# Patient Record
Sex: Female | Born: 1985 | Race: Black or African American | Hispanic: No | Marital: Single | Smoking: Never smoker
Health system: Southern US, Community
[De-identification: ages and names within clinical notes are randomized; demographics above are authoritative.]

## PROBLEM LIST (undated history)

## (undated) DIAGNOSIS — G43909 Migraine, unspecified, not intractable, without status migrainosus: Secondary | ICD-10-CM

---
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
## 2004-03-19 ENCOUNTER — Other Ambulatory Visit: Admission: RE | Admit: 2004-03-19 | Discharge: 2004-03-19 | Payer: Self-pay | Admitting: Family Medicine

## 2004-04-08 ENCOUNTER — Encounter: Admission: RE | Admit: 2004-04-08 | Discharge: 2004-04-08 | Payer: Self-pay | Admitting: Family Medicine

## 2004-11-25 ENCOUNTER — Emergency Department (HOSPITAL_COMMUNITY): Admission: EM | Admit: 2004-11-25 | Discharge: 2004-11-25 | Payer: Self-pay | Admitting: Emergency Medicine

## 2005-04-28 ENCOUNTER — Other Ambulatory Visit: Admission: RE | Admit: 2005-04-28 | Discharge: 2005-04-28 | Payer: Self-pay | Admitting: Family Medicine

## 2005-09-25 IMAGING — CR DG KNEE 1-2V*L*
2 series · 2 of 2 positions shown · non-contrast
Comparison: none

CLINICAL DATA: Pain without trauma

Left knee 2 view: 
There is no evidence of fracture.  Normal alignment. There is no evidence of
arthropathy or other focal bone abnormality.  Soft tissues are unremarkable.

[view not recorded (1 of 2)]
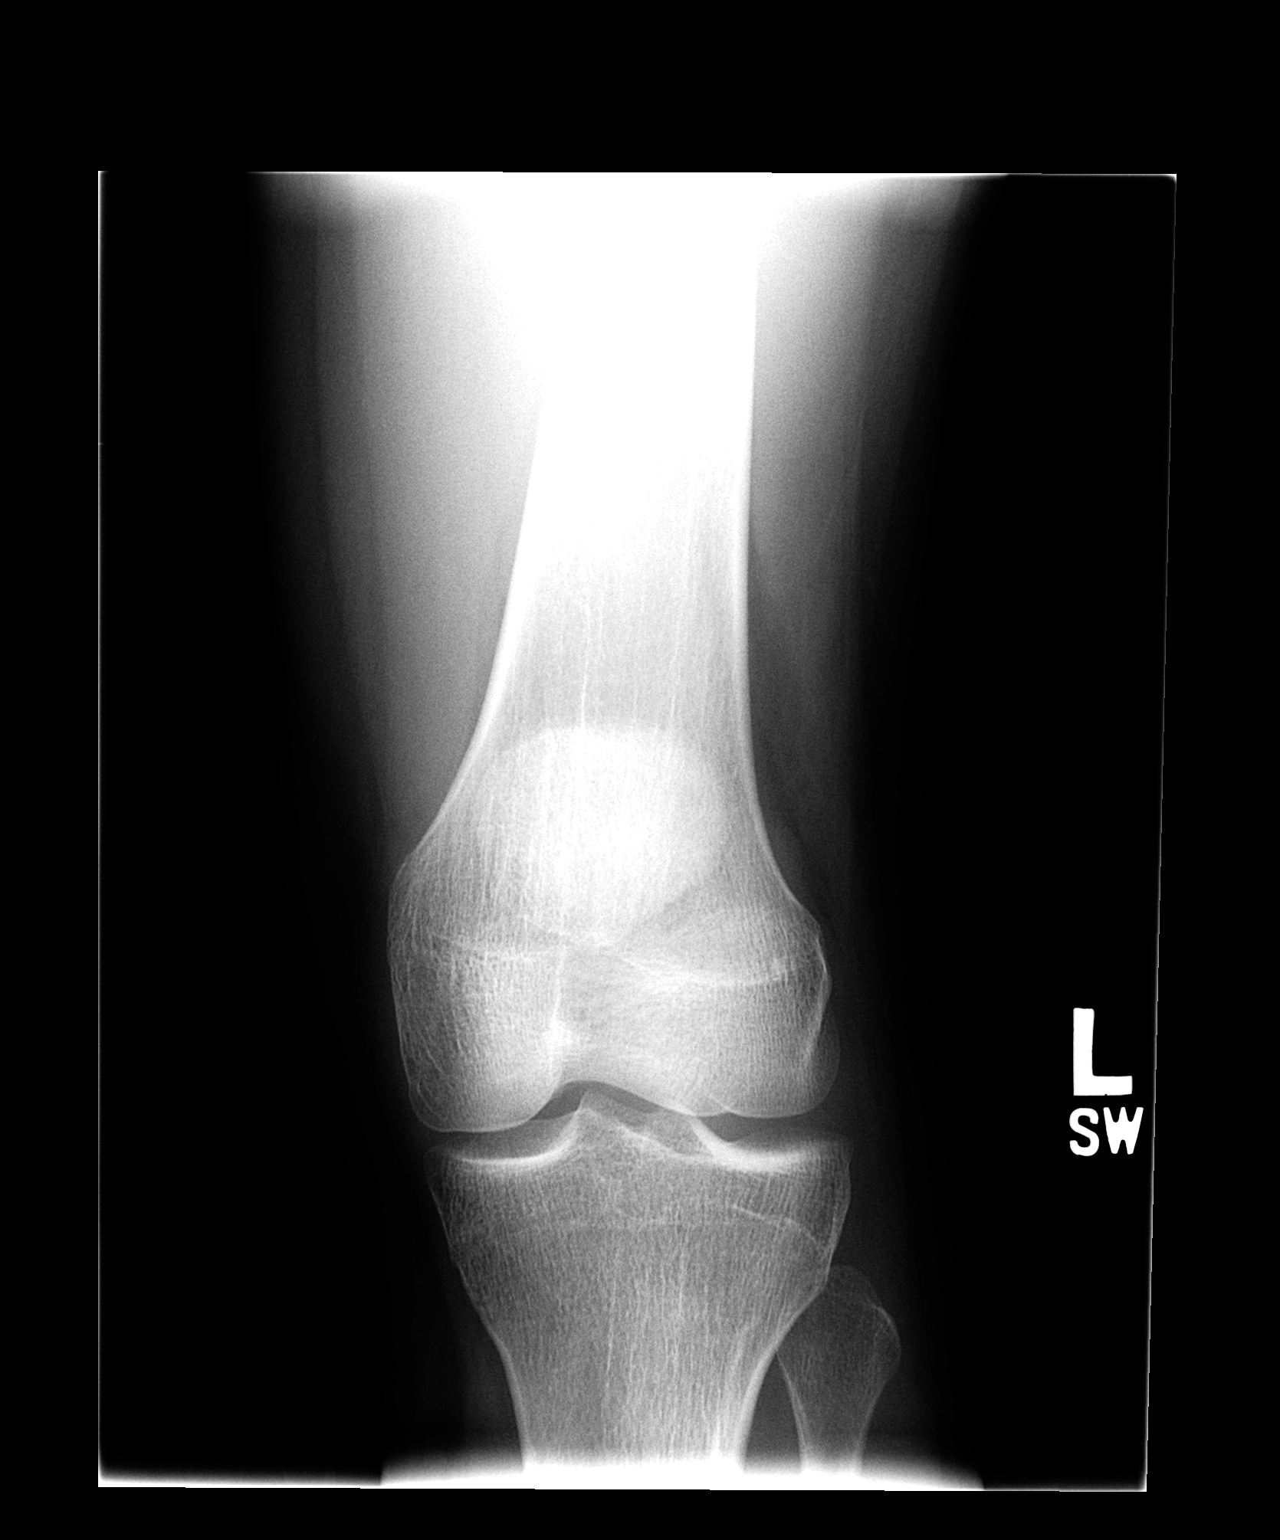

[view not recorded (2 of 2)]
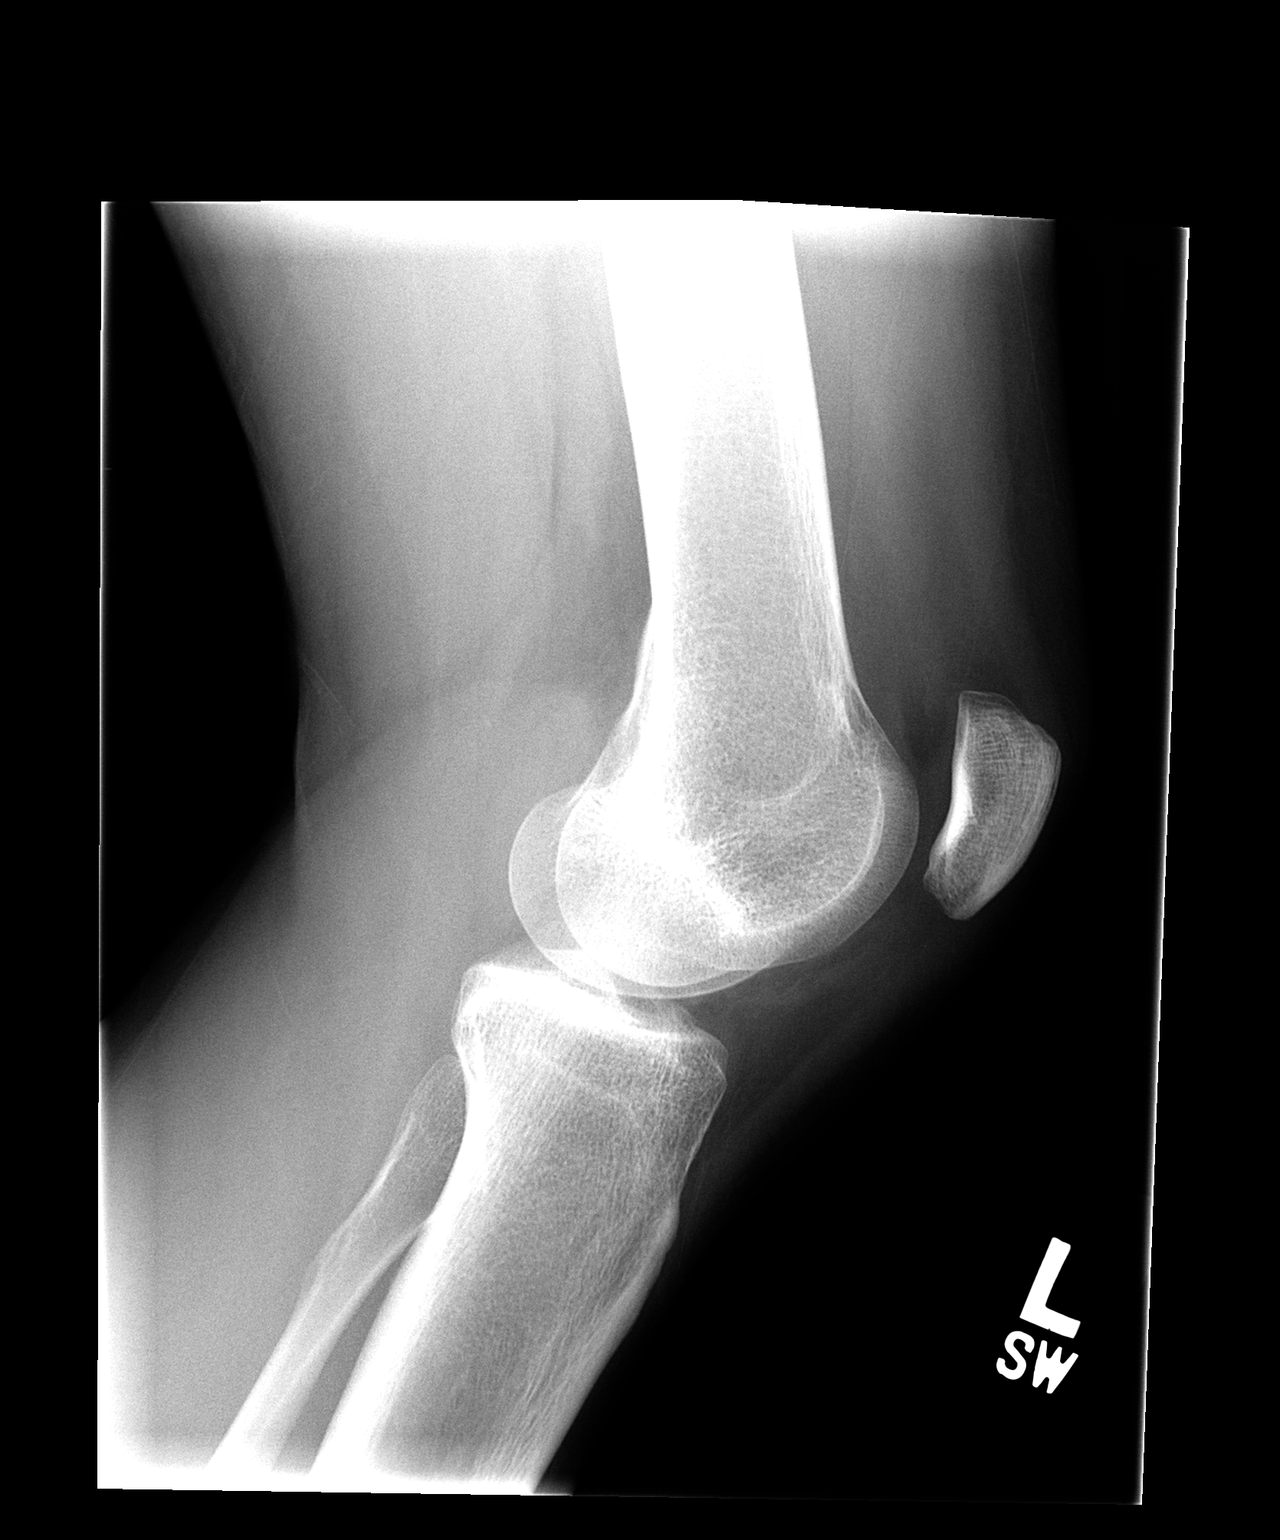

[2 of 2 positions shown; findings below may reference images not displayed]

IMPRESSION: Negative.

## 2006-01-03 ENCOUNTER — Emergency Department (HOSPITAL_COMMUNITY): Admission: EM | Admit: 2006-01-03 | Discharge: 2006-01-04 | Payer: Self-pay | Admitting: Emergency Medicine

## 2006-04-05 ENCOUNTER — Emergency Department (HOSPITAL_COMMUNITY): Admission: EM | Admit: 2006-04-05 | Discharge: 2006-04-05 | Payer: Self-pay | Admitting: Emergency Medicine

## 2006-06-03 ENCOUNTER — Other Ambulatory Visit: Admission: RE | Admit: 2006-06-03 | Discharge: 2006-06-03 | Payer: Self-pay | Admitting: Family Medicine

## 2007-07-27 ENCOUNTER — Other Ambulatory Visit: Admission: RE | Admit: 2007-07-27 | Discharge: 2007-07-27 | Payer: Self-pay | Admitting: Family Medicine

## 2008-09-15 ENCOUNTER — Other Ambulatory Visit: Admission: RE | Admit: 2008-09-15 | Discharge: 2008-09-15 | Payer: Self-pay | Admitting: Family Medicine

## 2008-10-28 ENCOUNTER — Emergency Department (HOSPITAL_COMMUNITY): Admission: EM | Admit: 2008-10-28 | Discharge: 2008-10-28 | Payer: Self-pay | Admitting: Emergency Medicine

## 2009-10-10 ENCOUNTER — Other Ambulatory Visit: Admission: RE | Admit: 2009-10-10 | Discharge: 2009-10-10 | Payer: Self-pay | Admitting: Family Medicine

## 2010-04-18 LAB — URINALYSIS, ROUTINE W REFLEX MICROSCOPIC
Hgb urine dipstick: NEGATIVE
Ketones, ur: 15 mg/dL — AB
Nitrite: NEGATIVE
Specific Gravity, Urine: 1.026 (ref 1.005–1.030)
pH: 5.5 (ref 5.0–8.0)

## 2010-04-18 LAB — CBC
HCT: 38.7 % (ref 36.0–46.0)
Hemoglobin: 13.1 g/dL (ref 12.0–15.0)
MCV: 89.5 fL (ref 78.0–100.0)
Platelets: 280 10*3/uL (ref 150–400)
RDW: 13.3 % (ref 11.5–15.5)
WBC: 7.7 10*3/uL (ref 4.0–10.5)

## 2010-04-18 LAB — COMPREHENSIVE METABOLIC PANEL
ALT: 14 U/L (ref 0–35)
Albumin: 3.6 g/dL (ref 3.5–5.2)
Alkaline Phosphatase: 35 U/L — ABNORMAL LOW (ref 39–117)
BUN: 7 mg/dL (ref 6–23)
Chloride: 102 mEq/L (ref 96–112)
Potassium: 3.6 mEq/L (ref 3.5–5.1)
Total Bilirubin: 0.8 mg/dL (ref 0.3–1.2)
Total Protein: 7.7 g/dL (ref 6.0–8.3)

## 2010-04-18 LAB — URINE MICROSCOPIC-ADD ON

## 2010-04-18 LAB — PREGNANCY, URINE: Preg Test, Ur: NEGATIVE

## 2010-04-18 LAB — DIFFERENTIAL
Eosinophils Absolute: 0 10*3/uL (ref 0.0–0.7)
Eosinophils Relative: 0 % (ref 0–5)
Lymphs Abs: 1.1 10*3/uL (ref 0.7–4.0)
Monocytes Absolute: 0.5 10*3/uL (ref 0.1–1.0)

## 2010-04-18 LAB — LIPASE, BLOOD: Lipase: 26 U/L (ref 11–59)

## 2010-10-25 ENCOUNTER — Other Ambulatory Visit (HOSPITAL_COMMUNITY)
Admission: RE | Admit: 2010-10-25 | Discharge: 2010-10-25 | Disposition: A | Discharge status: Home / Self Care | Payer: Managed Care, Other (non HMO) | Source: Ambulatory Visit | Attending: Family Medicine | Admitting: Family Medicine

## 2010-10-25 ENCOUNTER — Other Ambulatory Visit: Payer: Self-pay | Admitting: Physician Assistant

## 2010-10-25 DIAGNOSIS — Z124 Encounter for screening for malignant neoplasm of cervix: Secondary | ICD-10-CM | POA: Insufficient documentation

## 2011-10-31 ENCOUNTER — Other Ambulatory Visit: Payer: Self-pay | Admitting: Physician Assistant

## 2011-10-31 ENCOUNTER — Other Ambulatory Visit (HOSPITAL_COMMUNITY)
Admission: RE | Admit: 2011-10-31 | Discharge: 2011-10-31 | Disposition: A | Discharge status: Home / Self Care | Payer: Managed Care, Other (non HMO) | Source: Ambulatory Visit | Attending: Family Medicine | Admitting: Family Medicine

## 2011-10-31 DIAGNOSIS — Z124 Encounter for screening for malignant neoplasm of cervix: Secondary | ICD-10-CM | POA: Insufficient documentation

## 2012-08-17 ENCOUNTER — Other Ambulatory Visit: Payer: Self-pay | Admitting: Family Medicine

## 2012-08-17 DIAGNOSIS — N63 Unspecified lump in unspecified breast: Secondary | ICD-10-CM

## 2012-08-23 ENCOUNTER — Other Ambulatory Visit: Payer: Self-pay | Admitting: Family Medicine

## 2012-08-23 ENCOUNTER — Ambulatory Visit
Admission: RE | Admit: 2012-08-23 | Discharge: 2012-08-23 | Disposition: A | Discharge status: Home / Self Care | Payer: BC Managed Care – PPO | Source: Ambulatory Visit | Attending: Family Medicine | Admitting: Family Medicine

## 2012-08-23 DIAGNOSIS — N63 Unspecified lump in unspecified breast: Secondary | ICD-10-CM

## 2013-04-13 ENCOUNTER — Other Ambulatory Visit (HOSPITAL_COMMUNITY)
Admission: RE | Admit: 2013-04-13 | Discharge: 2013-04-13 | Disposition: A | Discharge status: Home / Self Care | Payer: BC Managed Care – PPO | Source: Ambulatory Visit | Attending: Family Medicine | Admitting: Family Medicine

## 2013-04-13 ENCOUNTER — Other Ambulatory Visit: Payer: Self-pay | Admitting: Physician Assistant

## 2013-04-13 DIAGNOSIS — Z124 Encounter for screening for malignant neoplasm of cervix: Secondary | ICD-10-CM | POA: Insufficient documentation

## 2014-02-27 ENCOUNTER — Emergency Department (HOSPITAL_BASED_OUTPATIENT_CLINIC_OR_DEPARTMENT_OTHER)
Admission: EM | Admit: 2014-02-27 | Discharge: 2014-02-27 | Disposition: A | Discharge status: Home / Self Care | Payer: BLUE CROSS/BLUE SHIELD | Attending: Emergency Medicine | Admitting: Emergency Medicine

## 2014-02-27 ENCOUNTER — Encounter (HOSPITAL_BASED_OUTPATIENT_CLINIC_OR_DEPARTMENT_OTHER): Payer: Self-pay | Admitting: Emergency Medicine

## 2014-02-27 DIAGNOSIS — Z3202 Encounter for pregnancy test, result negative: Secondary | ICD-10-CM | POA: Insufficient documentation

## 2014-02-27 DIAGNOSIS — Z79899 Other long term (current) drug therapy: Secondary | ICD-10-CM | POA: Diagnosis not present

## 2014-02-27 DIAGNOSIS — Z8679 Personal history of other diseases of the circulatory system: Secondary | ICD-10-CM | POA: Insufficient documentation

## 2014-02-27 DIAGNOSIS — R1013 Epigastric pain: Secondary | ICD-10-CM | POA: Diagnosis present

## 2014-02-27 DIAGNOSIS — K529 Noninfective gastroenteritis and colitis, unspecified: Secondary | ICD-10-CM | POA: Diagnosis not present

## 2014-02-27 HISTORY — DX: Migraine, unspecified, not intractable, without status migrainosus: G43.909

## 2014-02-27 LAB — URINALYSIS, ROUTINE W REFLEX MICROSCOPIC
Bilirubin Urine: NEGATIVE
GLUCOSE, UA: NEGATIVE mg/dL
HGB URINE DIPSTICK: NEGATIVE
KETONES UR: 15 mg/dL — AB
Leukocytes, UA: NEGATIVE
NITRITE: NEGATIVE
PH: 5.5 (ref 5.0–8.0)
PROTEIN: NEGATIVE mg/dL
SPECIFIC GRAVITY, URINE: 1.024 (ref 1.005–1.030)
UROBILINOGEN UA: 0.2 mg/dL (ref 0.0–1.0)

## 2014-02-27 LAB — PREGNANCY, URINE: Preg Test, Ur: NEGATIVE

## 2014-02-27 MED ORDER — PANTOPRAZOLE SODIUM 40 MG IV SOLR
40.0000 mg | Freq: Once | INTRAVENOUS | Status: AC
  Administered 2014-02-27: 40 mg via INTRAVENOUS
  Filled 2014-02-27: qty 40

## 2014-02-27 MED ORDER — SODIUM CHLORIDE 0.9 % IV BOLUS (SEPSIS)
1000.0000 mL | Freq: Once | INTRAVENOUS | Status: AC
  Administered 2014-02-27: 1000 mL via INTRAVENOUS

## 2014-02-27 MED ORDER — FENTANYL CITRATE 0.05 MG/ML IJ SOLN
50.0000 ug | Freq: Once | INTRAMUSCULAR | Status: AC
  Administered 2014-02-27: 50 ug via INTRAVENOUS
  Filled 2014-02-27: qty 2

## 2014-02-27 MED ORDER — ONDANSETRON HCL 4 MG/2ML IJ SOLN
4.0000 mg | Freq: Once | INTRAMUSCULAR | Status: AC
  Administered 2014-02-27: 4 mg via INTRAVENOUS
  Filled 2014-02-27: qty 2

## 2014-02-27 MED ORDER — ONDANSETRON 8 MG PO TBDP: 8.0000 mg | ORAL_TABLET | Freq: Three times a day (TID) | ORAL | Status: DC | PRN

## 2014-02-27 NOTE — ED Notes (Signed)
Pt reports N/V/D with emesis x4 diarrhea x5  And constant abd pain since 1 am tonight

## 2014-02-27 NOTE — ED Provider Notes (Signed)
CSN: [id number]     Arrival date & time 02/27/14  0504 History   First MD Initiated Contact with Patient 02/27/14 0539     Chief Complaint  Patient presents with  . Abdominal Pain     (Consider location/radiation/quality/duration/timing/severity/associated sxs/prior Treatment) HPI  This is a 29 year old female with nausea, vomiting and diarrhea that began about 1:00 this morning. It was associated with abdominal cramping which has now become a sharp epigastric pain. The pain is moderate in intensity and not worse with movement or palpation. She feels weak and was noted to be tachycardic on arrival. She has felt both hot and cold at times. She was afebrile arrival. She has not taken anything for this.  Past Medical History  Diagnosis Date  . Migraine    History reviewed. No pertinent past surgical history. History reviewed. No pertinent family history. History  Substance Use Topics  . Smoking status: Never Smoker   . Smokeless tobacco: Never Used  . Alcohol Use: Yes   OB History    Gravida Para Term Preterm AB TAB SAB Ectopic Multiple Living       Review of Systems  All other systems reviewed and are negative.   Allergies  Review of patient's allergies indicates no known allergies.  Home Medications   Prior to Admission medications   Medication Sig Start Date End Date Taking? Authorizing Provider  norethindrone-ethinyl estradiol-iron (MICROGESTIN FE,GILDESS FE,LOESTRIN FE) 1.5-30 MG-MCG tablet Take 1 tablet by mouth daily.   Yes Historical Provider, MD   BP 110/68 mmHg  Pulse 110  Temp(Src) 98.1 F (36.7 C) (Oral)  Resp 20  Ht 5' 5.5" (1.664 m)  Wt 133 lb (60.328 kg)  BMI 21.79 kg/m2  SpO2 99%  LMP 01/23/2014   Physical Exam  General: Well-developed, well-nourished female in no acute distress; appearance consistent with age of record HENT: normocephalic; atraumatic Eyes: pupils equal, round and reactive to light; extraocular muscles  intact Neck: supple Heart: regular rate and rhythm; tachycardia Lungs: clear to auscultation bilaterally Abdomen: soft; nondistended; nontender; no masses or hepatosplenomegaly; bowel sounds present Extremities: No deformity; full range of motion; pulses normal Neurologic: Awake, alert and oriented; motor function intact in all extremities and symmetric; no facial droop Skin: Warm and dry Psychiatric: Normal mood and affect    ED Course  Procedures (including critical care time)    MDM  Nursing notes and vitals signs, including pulse oximetry, reviewed.  Summary of this visit's results, reviewed by myself:  Labs:  Results for orders placed or performed during the hospital encounter of 02/27/14 (from the past 24 hour(s))  Urinalysis, Routine w reflex microscopic     Status: Abnormal   Collection Time: 02/27/14  5:39 AM  Result Value Ref Range   Color, Urine YELLOW YELLOW   APPearance CLEAR CLEAR   Specific Gravity, Urine 1.024 1.005 - 1.030   pH 5.5 5.0 - 8.0   Glucose, UA NEGATIVE NEGATIVE mg/dL   Hgb urine dipstick NEGATIVE NEGATIVE   Bilirubin Urine NEGATIVE NEGATIVE   Ketones, ur 15 (A) NEGATIVE mg/dL   Protein, ur NEGATIVE NEGATIVE mg/dL   Urobilinogen, UA 0.2 0.0 - 1.0 mg/dL   Nitrite NEGATIVE NEGATIVE   Leukocytes, UA NEGATIVE NEGATIVE  Pregnancy, urine     Status: None   Collection Time: 02/27/14  5:39 AM  Result Value Ref Range   Preg Test, Ur NEGATIVE NEGATIVE   6:33 AM Patient feeling much better after  IV fluids and medications.        Hanley SeamenJohn L Marayah Higdon, MD 02/27/14 [phone]

## 2014-02-27 NOTE — ED Notes (Signed)
Patient gave urine sample. 

## 2014-06-30 ENCOUNTER — Other Ambulatory Visit: Payer: Self-pay | Admitting: Physician Assistant

## 2014-07-05 LAB — CYTOLOGY - PAP

## 2015-04-21 DIAGNOSIS — J029 Acute pharyngitis, unspecified: Secondary | ICD-10-CM | POA: Diagnosis not present

## 2015-07-06 ENCOUNTER — Other Ambulatory Visit (HOSPITAL_COMMUNITY)
Admission: RE | Admit: 2015-07-06 | Discharge: 2015-07-06 | Disposition: A | Discharge status: Home / Self Care | Payer: BLUE CROSS/BLUE SHIELD | Source: Ambulatory Visit | Attending: Physician Assistant | Admitting: Physician Assistant

## 2015-07-06 ENCOUNTER — Other Ambulatory Visit: Payer: Self-pay | Admitting: Physician Assistant

## 2015-07-06 DIAGNOSIS — Z124 Encounter for screening for malignant neoplasm of cervix: Secondary | ICD-10-CM | POA: Diagnosis not present

## 2015-07-06 DIAGNOSIS — Z1151 Encounter for screening for human papillomavirus (HPV): Secondary | ICD-10-CM | POA: Insufficient documentation

## 2015-07-06 DIAGNOSIS — Z0000 Encounter for general adult medical examination without abnormal findings: Secondary | ICD-10-CM | POA: Diagnosis not present

## 2015-07-06 DIAGNOSIS — G43909 Migraine, unspecified, not intractable, without status migrainosus: Secondary | ICD-10-CM | POA: Diagnosis not present

## 2015-07-10 LAB — CYTOLOGY - PAP

## 2015-09-12 ENCOUNTER — Other Ambulatory Visit: Payer: Self-pay | Admitting: Obstetrics & Gynecology

## 2015-09-12 DIAGNOSIS — R87612 Low grade squamous intraepithelial lesion on cytologic smear of cervix (LGSIL): Secondary | ICD-10-CM | POA: Diagnosis not present

## 2015-09-12 DIAGNOSIS — N72 Inflammatory disease of cervix uteri: Secondary | ICD-10-CM | POA: Diagnosis not present

## 2015-09-12 DIAGNOSIS — Z3202 Encounter for pregnancy test, result negative: Secondary | ICD-10-CM | POA: Diagnosis not present

## 2015-09-12 DIAGNOSIS — N871 Moderate cervical dysplasia: Secondary | ICD-10-CM | POA: Diagnosis not present

## 2015-09-12 DIAGNOSIS — R87810 Cervical high risk human papillomavirus (HPV) DNA test positive: Secondary | ICD-10-CM | POA: Diagnosis not present

## 2015-09-12 DIAGNOSIS — N870 Mild cervical dysplasia: Secondary | ICD-10-CM | POA: Diagnosis not present

## 2015-09-12 DIAGNOSIS — D069 Carcinoma in situ of cervix, unspecified: Secondary | ICD-10-CM | POA: Diagnosis not present

## 2015-10-02 ENCOUNTER — Other Ambulatory Visit: Payer: Self-pay | Admitting: Obstetrics & Gynecology

## 2015-10-02 DIAGNOSIS — Z3202 Encounter for pregnancy test, result negative: Secondary | ICD-10-CM | POA: Diagnosis not present

## 2015-10-02 DIAGNOSIS — D069 Carcinoma in situ of cervix, unspecified: Secondary | ICD-10-CM | POA: Diagnosis not present

## 2015-10-02 DIAGNOSIS — N871 Moderate cervical dysplasia: Secondary | ICD-10-CM | POA: Diagnosis not present

## 2016-03-07 DIAGNOSIS — G43009 Migraine without aura, not intractable, without status migrainosus: Secondary | ICD-10-CM | POA: Diagnosis not present

## 2016-04-11 DIAGNOSIS — G43119 Migraine with aura, intractable, without status migrainosus: Secondary | ICD-10-CM | POA: Diagnosis not present

## 2016-05-19 DIAGNOSIS — R079 Chest pain, unspecified: Secondary | ICD-10-CM | POA: Diagnosis not present

## 2016-06-13 DIAGNOSIS — G43119 Migraine with aura, intractable, without status migrainosus: Secondary | ICD-10-CM | POA: Diagnosis not present

## 2016-06-22 ENCOUNTER — Encounter (HOSPITAL_BASED_OUTPATIENT_CLINIC_OR_DEPARTMENT_OTHER): Payer: Self-pay | Admitting: *Deleted

## 2016-06-22 ENCOUNTER — Emergency Department (HOSPITAL_BASED_OUTPATIENT_CLINIC_OR_DEPARTMENT_OTHER)
Admission: EM | Admit: 2016-06-22 | Discharge: 2016-06-23 | Disposition: A | Discharge status: Home / Self Care | Payer: BLUE CROSS/BLUE SHIELD | Attending: Emergency Medicine | Admitting: Emergency Medicine

## 2016-06-22 DIAGNOSIS — G43409 Hemiplegic migraine, not intractable, without status migrainosus: Secondary | ICD-10-CM | POA: Diagnosis not present

## 2016-06-22 DIAGNOSIS — Z793 Long term (current) use of hormonal contraceptives: Secondary | ICD-10-CM | POA: Insufficient documentation

## 2016-06-22 DIAGNOSIS — R110 Nausea: Secondary | ICD-10-CM | POA: Diagnosis present

## 2016-06-22 LAB — URINALYSIS, ROUTINE W REFLEX MICROSCOPIC
Bilirubin Urine: NEGATIVE
Glucose, UA: NEGATIVE mg/dL
Hgb urine dipstick: NEGATIVE
KETONES UR: 15 mg/dL — AB
LEUKOCYTES UA: NEGATIVE
NITRITE: NEGATIVE
PH: 7 (ref 5.0–8.0)
PROTEIN: NEGATIVE mg/dL
Specific Gravity, Urine: 1.003 — ABNORMAL LOW (ref 1.005–1.030)

## 2016-06-22 LAB — PREGNANCY, URINE: PREG TEST UR: NEGATIVE

## 2016-06-22 MED ORDER — DIPHENHYDRAMINE HCL 50 MG/ML IJ SOLN
25.0000 mg | Freq: Once | INTRAMUSCULAR | Status: AC
  Administered 2016-06-22: 25 mg via INTRAVENOUS
  Filled 2016-06-22: qty 1

## 2016-06-22 MED ORDER — ONDANSETRON HCL 4 MG/2ML IJ SOLN: 4.0000 mg | Freq: Once | INTRAMUSCULAR | Status: DC

## 2016-06-22 MED ORDER — SODIUM CHLORIDE 0.9 % IV BOLUS (SEPSIS)
1000.0000 mL | Freq: Once | INTRAVENOUS | Status: AC
  Administered 2016-06-22: 1000 mL via INTRAVENOUS

## 2016-06-22 MED ORDER — KETOROLAC TROMETHAMINE 15 MG/ML IJ SOLN
15.0000 mg | Freq: Once | INTRAMUSCULAR | Status: AC
  Administered 2016-06-22: 15 mg via INTRAVENOUS
  Filled 2016-06-22: qty 1

## 2016-06-22 MED ORDER — METOCLOPRAMIDE HCL 5 MG/ML IJ SOLN
10.0000 mg | Freq: Once | INTRAMUSCULAR | Status: AC
  Administered 2016-06-22: 10 mg via INTRAVENOUS
  Filled 2016-06-22: qty 2

## 2016-06-22 NOTE — ED Provider Notes (Signed)
[facility] Provider Note: Julia Dell, MD, FACEP  CSN: [id number] MRN: [id number] ARRIVAL: 06/22/16 at 2244 ROOM: [facility]   CHIEF COMPLAINT  Nausea   HISTORY OF PRESENT ILLNESS  Julia Blevins is a 31 y.o. female with a history of migraines. She went dancing yesterday evening but did not drink. She states she did a lot of sweating but tried to stay hydrated. Since this morning she has had generalized fatigue, nausea without vomiting and headache. The headache is located on the left side of her head. The headache is like her usual migraines but milder. She rates it as a 6 out of 10. She took an Imitrex tablet this morning without relief. She has slept on and off most of the day and woke up about 6 PM still nauseated and fatigued. She is having some mild photophobia.   Past Medical History:  Diagnosis Date  . Migraine     History reviewed. No pertinent surgical history.  History reviewed. No pertinent family history.  Social History  Substance Use Topics  . Smoking status: Never Smoker  . Smokeless tobacco: Never Used  . Alcohol use Yes    Prior to Admission medications   Medication Sig Start Date End Date Taking? Authorizing Provider  SUMAtriptan (IMITREX) 25 MG tablet Take 25 mg by mouth every 2 (two) hours as needed for migraine. May repeat in 2 hours if headache persists or recurs.   Yes [provider]  norethindrone-ethinyl estradiol-iron (MICROGESTIN FE,GILDESS FE,LOESTRIN FE) 1.5-30 MG-MCG tablet Take 1 tablet by mouth daily.    [provider]  ondansetron (ZOFRAN ODT) 8 MG disintegrating tablet Take 1 tablet (8 mg total) by mouth every 8 (eight) hours as needed for nausea or vomiting. 02/27/14   Blaise Grieshaber, Julia Ruiz, MD    Allergies Patient has no known allergies.   REVIEW OF SYSTEMS  Negative except as noted here or in the History of Present Illness.   PHYSICAL EXAMINATION  Initial Vital Signs Blood pressure (!) 121/94, pulse 76,  temperature 98.1 F (36.7 C), temperature source Oral, resp. rate 16, height 5' 5.5" (1.664 m), weight 61.2 kg (135 lb), last menstrual period 06/15/2016, SpO2 97 %.  Examination General: Well-developed, well-nourished female in no acute distress; appearance consistent with age of record HENT: normocephalic; atraumatic Eyes: pupils equal, round and reactive to light; extraocular muscles intact Neck: supple Heart: regular rate and rhythm Lungs: clear to auscultation bilaterally Abdomen: soft; nondistended; nontender; no masses or hepatosplenomegaly; bowel sounds present Extremities: No deformity; full range of motion; pulses normal Neurologic: Awake, alert and oriented; motor function intact in all extremities and symmetric; no facial droop Skin: Warm and dry Psychiatric: Normal mood and affect   RESULTS  Summary of this visit's results, reviewed by myself:   EKG Interpretation  Date/Time:    Ventricular Rate:    PR Interval:    QRS Duration:   QT Interval:    QTC Calculation:   R Axis:     Text Interpretation:        Laboratory Studies: Results for orders placed or performed during the hospital encounter of 06/22/16 (from the past 24 hour(s))  Pregnancy, urine     Status: None   Collection Time: 06/22/16 11:04 PM  Result Value Ref Range   Preg Test, Ur NEGATIVE NEGATIVE  Urinalysis, Routine w reflex microscopic     Status: Abnormal   Collection Time: 06/22/16 11:04 PM  Result Value Ref Range   Color, Urine YELLOW YELLOW  APPearance CLEAR CLEAR   Specific Gravity, Urine 1.003 (L) 1.005 - 1.030   pH 7.0 5.0 - 8.0   Glucose, UA NEGATIVE NEGATIVE mg/dL   Hgb urine dipstick NEGATIVE NEGATIVE   Bilirubin Urine NEGATIVE NEGATIVE   Ketones, ur 15 (A) NEGATIVE mg/dL   Protein, ur NEGATIVE NEGATIVE mg/dL   Nitrite NEGATIVE NEGATIVE   Leukocytes, UA NEGATIVE NEGATIVE   Imaging Studies: No results found.  ED COURSE  Nursing notes and initial vitals signs, including  pulse oximetry, reviewed.  Vitals:   06/22/16 2250  BP: (!) 121/94  Pulse: 76  Resp: 16  Temp: 98.1 F (36.7 C)  TempSrc: Oral  SpO2: 97%  Weight: 61.2 kg (135 lb)  Height: 5' 5.5" (1.664 m)   12:16 AM Pain, nausea relieved after IV medications.  PROCEDURES    ED DIAGNOSES     ICD-10-CM   1. Sporadic migraine G43.409        Khadeem Rockett, MD 06/23/16 [phone]

## 2016-06-22 NOTE — ED Triage Notes (Addendum)
Pt states she has been nauseated since getting up at 6p. Hx migraines. Took Imitrex without relief. Went dancing last night and did a lot of sweating, but tried to stay hydrated.

## 2016-06-26 DIAGNOSIS — F321 Major depressive disorder, single episode, moderate: Secondary | ICD-10-CM | POA: Diagnosis not present

## 2016-07-10 DIAGNOSIS — F321 Major depressive disorder, single episode, moderate: Secondary | ICD-10-CM | POA: Diagnosis not present

## 2016-07-11 ENCOUNTER — Other Ambulatory Visit (HOSPITAL_COMMUNITY)
Admission: RE | Admit: 2016-07-11 | Discharge: 2016-07-11 | Disposition: A | Discharge status: Home / Self Care | Payer: BLUE CROSS/BLUE SHIELD | Source: Ambulatory Visit | Attending: Family Medicine | Admitting: Family Medicine

## 2016-07-11 ENCOUNTER — Other Ambulatory Visit: Payer: Self-pay | Admitting: Physician Assistant

## 2016-07-11 DIAGNOSIS — Z01419 Encounter for gynecological examination (general) (routine) without abnormal findings: Secondary | ICD-10-CM | POA: Diagnosis not present

## 2016-07-11 DIAGNOSIS — Z124 Encounter for screening for malignant neoplasm of cervix: Secondary | ICD-10-CM | POA: Diagnosis not present

## 2016-07-11 DIAGNOSIS — Z0000 Encounter for general adult medical examination without abnormal findings: Secondary | ICD-10-CM | POA: Diagnosis not present

## 2016-07-11 DIAGNOSIS — Z113 Encounter for screening for infections with a predominantly sexual mode of transmission: Secondary | ICD-10-CM | POA: Diagnosis not present

## 2016-07-11 DIAGNOSIS — R87810 Cervical high risk human papillomavirus (HPV) DNA test positive: Secondary | ICD-10-CM | POA: Diagnosis not present

## 2016-07-11 DIAGNOSIS — G43909 Migraine, unspecified, not intractable, without status migrainosus: Secondary | ICD-10-CM | POA: Diagnosis not present

## 2016-07-17 LAB — CYTOLOGY - PAP: Diagnosis: NEGATIVE

## 2016-07-24 DIAGNOSIS — F321 Major depressive disorder, single episode, moderate: Secondary | ICD-10-CM | POA: Diagnosis not present

## 2016-08-21 DIAGNOSIS — F321 Major depressive disorder, single episode, moderate: Secondary | ICD-10-CM | POA: Diagnosis not present

## 2016-09-04 DIAGNOSIS — F321 Major depressive disorder, single episode, moderate: Secondary | ICD-10-CM | POA: Diagnosis not present

## 2016-09-08 DIAGNOSIS — G43119 Migraine with aura, intractable, without status migrainosus: Secondary | ICD-10-CM | POA: Diagnosis not present

## 2016-09-30 DIAGNOSIS — F321 Major depressive disorder, single episode, moderate: Secondary | ICD-10-CM | POA: Diagnosis not present

## 2016-10-03 DIAGNOSIS — Z0000 Encounter for general adult medical examination without abnormal findings: Secondary | ICD-10-CM | POA: Diagnosis not present

## 2016-12-10 DIAGNOSIS — G43119 Migraine with aura, intractable, without status migrainosus: Secondary | ICD-10-CM | POA: Diagnosis not present

## 2017-03-13 DIAGNOSIS — G43119 Migraine with aura, intractable, without status migrainosus: Secondary | ICD-10-CM | POA: Diagnosis not present

## 2017-03-17 DIAGNOSIS — F321 Major depressive disorder, single episode, moderate: Secondary | ICD-10-CM | POA: Diagnosis not present

## 2017-03-31 DIAGNOSIS — F321 Major depressive disorder, single episode, moderate: Secondary | ICD-10-CM | POA: Diagnosis not present

## 2017-04-14 DIAGNOSIS — F321 Major depressive disorder, single episode, moderate: Secondary | ICD-10-CM | POA: Diagnosis not present

## 2017-04-28 DIAGNOSIS — F321 Major depressive disorder, single episode, moderate: Secondary | ICD-10-CM | POA: Diagnosis not present

## 2017-05-05 DIAGNOSIS — G43119 Migraine with aura, intractable, without status migrainosus: Secondary | ICD-10-CM | POA: Diagnosis not present

## 2017-05-12 DIAGNOSIS — F321 Major depressive disorder, single episode, moderate: Secondary | ICD-10-CM | POA: Diagnosis not present

## 2017-05-22 DIAGNOSIS — G43001 Migraine without aura, not intractable, with status migrainosus: Secondary | ICD-10-CM | POA: Diagnosis not present

## 2017-05-26 DIAGNOSIS — F321 Major depressive disorder, single episode, moderate: Secondary | ICD-10-CM | POA: Diagnosis not present

## 2017-06-09 DIAGNOSIS — F321 Major depressive disorder, single episode, moderate: Secondary | ICD-10-CM | POA: Diagnosis not present

## 2017-06-23 DIAGNOSIS — F321 Major depressive disorder, single episode, moderate: Secondary | ICD-10-CM | POA: Diagnosis not present

## 2017-07-09 DIAGNOSIS — G43001 Migraine without aura, not intractable, with status migrainosus: Secondary | ICD-10-CM | POA: Diagnosis not present

## 2017-07-24 ENCOUNTER — Other Ambulatory Visit (HOSPITAL_COMMUNITY)
Admission: RE | Admit: 2017-07-24 | Discharge: 2017-07-24 | Disposition: A | Discharge status: Home / Self Care | Payer: BLUE CROSS/BLUE SHIELD | Source: Ambulatory Visit | Attending: Family Medicine | Admitting: Family Medicine

## 2017-07-24 ENCOUNTER — Other Ambulatory Visit: Payer: Self-pay | Admitting: Physician Assistant

## 2017-07-24 DIAGNOSIS — Z113 Encounter for screening for infections with a predominantly sexual mode of transmission: Secondary | ICD-10-CM | POA: Diagnosis not present

## 2017-07-24 DIAGNOSIS — Z0000 Encounter for general adult medical examination without abnormal findings: Secondary | ICD-10-CM | POA: Diagnosis not present

## 2017-07-24 DIAGNOSIS — Z124 Encounter for screening for malignant neoplasm of cervix: Secondary | ICD-10-CM | POA: Diagnosis not present

## 2017-07-24 DIAGNOSIS — Z01419 Encounter for gynecological examination (general) (routine) without abnormal findings: Secondary | ICD-10-CM | POA: Diagnosis not present

## 2017-07-27 LAB — CYTOLOGY - PAP: DIAGNOSIS: NEGATIVE

## 2017-08-04 DIAGNOSIS — F321 Major depressive disorder, single episode, moderate: Secondary | ICD-10-CM | POA: Diagnosis not present

## 2017-08-18 DIAGNOSIS — G43001 Migraine without aura, not intractable, with status migrainosus: Secondary | ICD-10-CM | POA: Diagnosis not present

## 2017-09-01 DIAGNOSIS — F321 Major depressive disorder, single episode, moderate: Secondary | ICD-10-CM | POA: Diagnosis not present

## 2017-09-17 DIAGNOSIS — G43001 Migraine without aura, not intractable, with status migrainosus: Secondary | ICD-10-CM | POA: Diagnosis not present

## 2017-10-27 DIAGNOSIS — G43001 Migraine without aura, not intractable, with status migrainosus: Secondary | ICD-10-CM | POA: Diagnosis not present

## 2017-12-31 DIAGNOSIS — G43909 Migraine, unspecified, not intractable, without status migrainosus: Secondary | ICD-10-CM | POA: Diagnosis not present

## 2018-03-08 DIAGNOSIS — Z113 Encounter for screening for infections with a predominantly sexual mode of transmission: Secondary | ICD-10-CM | POA: Diagnosis not present

## 2018-03-08 DIAGNOSIS — G43909 Migraine, unspecified, not intractable, without status migrainosus: Secondary | ICD-10-CM | POA: Diagnosis not present

## 2018-07-30 DIAGNOSIS — G43909 Migraine, unspecified, not intractable, without status migrainosus: Secondary | ICD-10-CM | POA: Diagnosis not present

## 2018-07-30 DIAGNOSIS — Z0000 Encounter for general adult medical examination without abnormal findings: Secondary | ICD-10-CM | POA: Diagnosis not present

## 2019-06-16 DIAGNOSIS — F4320 Adjustment disorder, unspecified: Secondary | ICD-10-CM | POA: Diagnosis not present

## 2019-06-22 DIAGNOSIS — F4320 Adjustment disorder, unspecified: Secondary | ICD-10-CM | POA: Diagnosis not present

## 2019-06-27 DIAGNOSIS — F4320 Adjustment disorder, unspecified: Secondary | ICD-10-CM | POA: Diagnosis not present

## 2019-07-06 DIAGNOSIS — F4320 Adjustment disorder, unspecified: Secondary | ICD-10-CM | POA: Diagnosis not present

## 2019-07-20 DIAGNOSIS — F4320 Adjustment disorder, unspecified: Secondary | ICD-10-CM | POA: Diagnosis not present

## 2019-08-08 DIAGNOSIS — F4320 Adjustment disorder, unspecified: Secondary | ICD-10-CM | POA: Diagnosis not present

## 2019-08-22 DIAGNOSIS — F4320 Adjustment disorder, unspecified: Secondary | ICD-10-CM | POA: Diagnosis not present

## 2019-09-15 DIAGNOSIS — Z20828 Contact with and (suspected) exposure to other viral communicable diseases: Secondary | ICD-10-CM | POA: Diagnosis not present

## 2019-10-10 DIAGNOSIS — F329 Major depressive disorder, single episode, unspecified: Secondary | ICD-10-CM | POA: Diagnosis not present

## 2019-10-10 DIAGNOSIS — F419 Anxiety disorder, unspecified: Secondary | ICD-10-CM | POA: Diagnosis not present

## 2019-10-10 DIAGNOSIS — Z7689 Persons encountering health services in other specified circumstances: Secondary | ICD-10-CM | POA: Diagnosis not present

## 2019-10-10 DIAGNOSIS — G43909 Migraine, unspecified, not intractable, without status migrainosus: Secondary | ICD-10-CM | POA: Diagnosis not present
